# Patient Record
Sex: Female | Born: 1960 | Race: Asian | Hispanic: No | Marital: Single | Smoking: Never smoker
Health system: Southern US, Community
[De-identification: ages and names within clinical notes are randomized; demographics above are authoritative.]

## PROBLEM LIST (undated history)

## (undated) DIAGNOSIS — K589 Irritable bowel syndrome without diarrhea: Secondary | ICD-10-CM

## (undated) HISTORY — PX: EYE SURGERY: SHX253

## (undated) HISTORY — DX: Irritable bowel syndrome without diarrhea: K58.9

## (undated) HISTORY — PX: BREAST SURGERY: SHX581

---

## 2014-07-05 ENCOUNTER — Encounter (HOSPITAL_COMMUNITY): Payer: Self-pay | Admitting: Emergency Medicine

## 2014-07-05 ENCOUNTER — Emergency Department (HOSPITAL_COMMUNITY)
Admission: EM | Admit: 2014-07-05 | Discharge: 2014-07-05 | Disposition: A | Discharge status: Home / Self Care | Payer: 59 | Source: Home / Self Care | Attending: Family Medicine | Admitting: Family Medicine

## 2014-07-05 DIAGNOSIS — F3112 Bipolar disorder, current episode manic without psychotic features, moderate: Secondary | ICD-10-CM

## 2014-07-05 MED ORDER — QUETIAPINE FUMARATE 50 MG PO TABS: ORAL_TABLET | ORAL | Status: DC

## 2014-07-05 NOTE — ED Notes (Signed)
Patient complains of lack of attention, unable to concentrate, difficulty following directions, poor listening, gets upset easily and distracted.

## 2014-07-05 NOTE — ED Provider Notes (Signed)
CSN: [id number]     Arrival date & time 07/05/14  0830 History   First MD Initiated Contact with Patient 07/05/14 0900     Chief Complaint  Patient presents with  . Agitation  . Anxiety   (Consider location/radiation/quality/duration/timing/severity/associated sxs/prior Treatment) HPI   54 y/o patient here with two-week history irritability, distractibility, racing thoughts, and decreased sleep. She explains that over the last 10 years this has happened several times a year lasting 2 or 3 weeks at a time. She also states that she has 2-3 week episodes of depressed mood that happen several times a year. She states that she's been tolerating and compensating for it at work as well as she could until last night when her daughter came back from college and she snapped at her inappropriately. She also states that over the last 2 weeks she's been having difficulties in meetings at work. She states that her thoughts are racing, she has a hard time focusing on the content in meetings, and she feels like she can't stand to be in the meeting any longer.   History reviewed. No pertinent past medical history. History reviewed. No pertinent past surgical history. No family history on file. History  Substance Use Topics  . Smoking status: Not on file  . Smokeless tobacco: Not on file  . Alcohol Use: No   OB History    No data available     Review of Systems  Constitutional: Positive for appetite change. Negative for fever and chills.  Respiratory: Negative for cough and chest tightness.   Cardiovascular: Negative for chest pain and palpitations.  Psychiatric/Behavioral: Positive for behavioral problems, confusion, sleep disturbance, decreased concentration and agitation. Negative for suicidal ideas, self-injury and dysphoric mood. The patient is not nervous/anxious.        Allergies  Review of patient's allergies indicates no known allergies.  Home Medications   Prior to Admission  medications   Medication Sig Start Date End Date Taking? Authorizing Provider  Multiple Vitamin (MULTIVITAMIN) tablet Take 1 tablet by mouth daily.   Yes Historical Provider, MD  QUEtiapine (SEROQUEL) 50 MG tablet Take 1 pill twice on day 1, then 2 pills twice on day 2, then 3 pills twice on day 4 then 4 pills twice daily from then on. 07/05/14   Elenora Gamma, MD   BP 126/89 mmHg  Pulse 77  Temp(Src) 97.3 F (36.3 C) (Oral)  Resp 16  SpO2 98% Physical Exam  Constitutional: She is oriented to person, place, and time. She appears well-developed and well-nourished. No distress.  HENT:  Head: Normocephalic and atraumatic.  Eyes: Right eye exhibits no discharge. Left eye exhibits no discharge.  Neck: Neck supple. No thyromegaly present.  Cardiovascular: Normal rate, regular rhythm and normal heart sounds.  Exam reveals no friction rub.   No murmur heard. Pulmonary/Chest: Breath sounds normal. No respiratory distress. She has no wheezes.  Musculoskeletal: She exhibits no edema.  Neurological: She is alert and oriented to person, place, and time.  Skin: She is not diaphoretic.  Psychiatric: Her speech is rapid and/or pressured. She is hyperactive. Cognition and memory are not impaired. She expresses impulsivity. She expresses no homicidal and no suicidal ideation. She expresses no suicidal plans and no homicidal plans.  Pressured affect  Nursing note and vitals reviewed.  MDQ with 8 yes's on number 1, Yes on number 2, Moderate problem on number 3, yes on number 4, no answer on 5.   ED Course  Procedures (including critical  care time) Labs Review Labs Reviewed - No data to display  Imaging Review No results found.   MDM   1. Bipolar disorder, current episode manic without psychotic features, moderate    54 year old female here with likely new diagnosis of bipolar disorder and manic episode. She has a 10+ year history of manic and depressive episodes. We discussed this thoroughly  and we have recommended her starting Seroquel today. Today she declines the Seroquel but states she may call back in 1-2 days to request a refill and of the prescription. Notably she denies suicidal and homicidal ideation. I have personally called and verified availability of follow-up appointments in 6 weeks with a psychiatrist which she is open to. We've also asked her to come back in one week for follow-up here.  Pt seen and discussed with Dr. Denyse Amassorey and he agrees with the plan as discussed above.       Elenora GammaSamuel L Easton Fetty, MD 07/05/14 1022  Elenora GammaSamuel L Landy Mace, MD 07/05/14 [phone]

## 2014-07-05 NOTE — Discharge Instructions (Signed)
Please come back to see Dr. Corey neDenyse Amassxt Tuesday or Thursday  Please do not go to work if you are not making rational decisions.   Please call Dr. Alwyn Renottle's office today for an appointment.   Bipolar Disorder Bipolar disorder is a mental illness. The term bipolar disorder actually is used to describe a group of disorders that all share varying degrees of emotional highs and lows that can interfere with daily functioning, such as work, school, or relationships. Bipolar disorder also can lead to drug abuse, hospitalization, and suicide. The emotional highs of bipolar disorder are periods of elation or irritability and high energy. These highs can range from a mild form (hypomania) to a severe form (mania). People experiencing episodes of hypomania may appear energetic, excitable, and highly productive. People experiencing mania may behave impulsively or erratically. They often make poor decisions. They may have difficulty sleeping. The most severe episodes of mania can involve having very distorted beliefs or perceptions about the world and seeing or hearing things that are not real (psychotic delusions and hallucinations).  The emotional lows of bipolar disorder (depression) also can range from mild to severe. Severe episodes of bipolar depression can involve psychotic delusions and hallucinations. Sometimes people with bipolar disorder experience a state of mixed mood. Symptoms of hypomania or mania and depression are both present during this mixed-mood episode. SIGNS AND SYMPTOMS There are signs and symptoms of the episodes of hypomania and mania as well as the episodes of depression. The signs and symptoms of hypomania and mania are similar but vary in severity. They include:  Inflated self-esteem or feeling of increased self-confidence.  Decreased need for sleep.  Unusual talkativeness (rapid or pressured speech) or the feeling of a need to keep talking.  Sensation of racing thoughts or  constant talking, with quick shifts between topics that may or may not be related (flight of ideas).  Decreased ability to focus or concentrate.  Increased purposeful activity, such as work, studies, or social activity, or nonproductive activity, such as pacing, squirming and fidgeting, or finger and toe tapping.  Impulsive behavior and use of poor judgment, resulting in high-risk activities, such as having unprotected sex or spending excessive amounts of money. Signs and symptoms of depression include the following:   Feelings of sadness, hopelessness, or helplessness.  Frequent or uncontrollable episodes of crying.  Lack of feeling anything or caring about anything.  Difficulty sleeping or sleeping too much.  Inability to enjoy the things you used to enjoy.   Desire to be alone all the time.   Feelings of guilt or worthlessness.  Lack of energy or motivation.   Difficulty concentrating, remembering, or making decisions.  Change in appetite or weight beyond normal fluctuations.  Thoughts of death or the desire to harm yourself. DIAGNOSIS  Bipolar disorder is diagnosed through an assessment by your caregiver. Your caregiver will ask questions about your emotional episodes. There are two main types of bipolar disorder. People with type I bipolar disorder have manic episodes with or without depressive episodes. People with type II bipolar disorder have hypomanic episodes and major depressive episodes, which are more serious than mild depression. The type of bipolar disorder you have can make an important difference in how your illness is monitored and treated. Your caregiver may ask questions about your medical history and use of alcohol or drugs, including prescription medication. Certain medical conditions and substances also can cause emotional highs and lows that resemble bipolar disorder (secondary bipolar disorder).  TREATMENT  Bipolar  disorder is a long-term illness. It  is best controlled with continuous treatment rather than treatment only when symptoms occur. The following treatments can be prescribed for bipolar disorders:  Medication--Medication can be prescribed by a doctor that is an expert in treating mental disorders (psychiatrists). Medications called mood stabilizers are usually prescribed to help control the illness. Other medications are sometimes added if symptoms of mania, depression, or psychotic delusions and hallucinations occur despite the use of a mood stabilizer.  Talk therapy--Some forms of talk therapy are helpful in providing support, education, and guidance. A combination of medication and talk therapy is best for managing the disorder over time. A procedure in which electricity is applied to your brain through your scalp (electroconvulsive therapy) is used in cases of severe mania when medication and talk therapy do not work or work too slowly. Document Released: 08/05/2000 Document Revised: 08/24/2012 Document Reviewed: 05/25/2012 [facility] Patient Information 2015 [place], [place]. This information is not intended to replace advice given to you by your health care provider. Make sure you discuss any questions you have with your health care provider.

## 2014-07-05 NOTE — ED Notes (Signed)
Dr Merrilee Seashoresamual bradshaw spoke with patient in treatment room

## 2014-07-05 NOTE — ED Notes (Signed)
On discharge, patient reported not wanting to follow plan for medicines and follow up, notified provider

## 2014-07-05 NOTE — ED Notes (Signed)
Went to treatment room to discharge patient-not in treatment room, papers and script gone.

## 2014-07-05 NOTE — ED Provider Notes (Signed)
Tonya Schultz is a 54 y.o. female who presents to Urgent Care today for mania. Patient presents to clinic today complaining of agitation and irritability and insomnia. This has happened over the past several days. She notes that she is having many arguments with her child and has stormed out of work meetings over the past week or so. This has happened before over she never followed up for it and it resolves spontaneously. She notes a positive family history of family members with similar symptoms. She denies any history of a personal diagnosis of bipolar disorder or depression.  She denies any suicidality or homicidality. She denies any hallucinations and does not appear to express any delusions.   History reviewed. No pertinent past medical history. History reviewed. No pertinent past surgical history. History  Substance Use Topics  . Smoking status: Not on file  . Smokeless tobacco: Not on file  . Alcohol Use: No   ROS as above Medications: No current facility-administered medications for this encounter.   Current Outpatient Prescriptions  Medication Sig Dispense Refill  . Multiple Vitamin (MULTIVITAMIN) tablet Take 1 tablet by mouth daily.    . QUEtiapine (SEROQUEL) 50 MG tablet Take 1 pill twice on day 1, then 2 pills twice on day 2, then 3 pills twice on day 4 then 4 pills twice daily from then on. 100 tablet 0   No Known Allergies   Exam:  BP 126/89 mmHg  Pulse 77  Temp(Src) 97.3 F (36.3 C) (Oral)  Resp 16  SpO2 98% Gen: Well NAD Psych: Alert and oriented speech is somewhat rapid and somewhat tangential. However she is able to answer questions appropriately. She appears to have somewhat limited insight into her disease. She is worried that she was being diagnosed as "a maniac".  No SI or HI. No delusions or hallucinations expressed.   No results found for this or any previous visit (from the past 24 hour(s)). No results found.  Assessment and Plan: 54 y.o. female with mania.  Patient is displaying symptoms of mania or hypomania. She is not currently psychotic. She appears to be capable of making her decisions at this time. Both myself and Dr. Ermalinda MemosBradshaw adamantly expressed our concern and encouraged her to follow-up with psychiatry. She was given phone numbers to a psychiatrist office. Additionally she was given a prescription for Seroquel which she did not keep. I have encouraged her to return to clinic ASAP should she worsen. I would like to see her back certainly in one week regardless of how she is feeling.  Discussed warning signs or symptoms. Please see discharge instructions. Patient expresses understanding.     Rodolph BongEvan S Lamekia Nolden, MD 07/05/14 1044

## 2015-02-17 ENCOUNTER — Ambulatory Visit (INDEPENDENT_AMBULATORY_CARE_PROVIDER_SITE_OTHER): Payer: 59 | Admitting: Internal Medicine

## 2015-02-17 ENCOUNTER — Other Ambulatory Visit (INDEPENDENT_AMBULATORY_CARE_PROVIDER_SITE_OTHER): Payer: 59

## 2015-02-17 ENCOUNTER — Encounter: Payer: Self-pay | Admitting: Internal Medicine

## 2015-02-17 VITALS — BP 134/86 | HR 90 | Temp 98.5°F | Ht 59.0 in | Wt 88.0 lb

## 2015-02-17 DIAGNOSIS — B1910 Unspecified viral hepatitis B without hepatic coma: Secondary | ICD-10-CM

## 2015-02-17 DIAGNOSIS — Z0000 Encounter for general adult medical examination without abnormal findings: Secondary | ICD-10-CM

## 2015-02-17 DIAGNOSIS — R768 Other specified abnormal immunological findings in serum: Secondary | ICD-10-CM | POA: Insufficient documentation

## 2015-02-17 DIAGNOSIS — B169 Acute hepatitis B without delta-agent and without hepatic coma: Secondary | ICD-10-CM | POA: Diagnosis not present

## 2015-02-17 DIAGNOSIS — E785 Hyperlipidemia, unspecified: Secondary | ICD-10-CM | POA: Insufficient documentation

## 2015-02-17 DIAGNOSIS — K589 Irritable bowel syndrome without diarrhea: Secondary | ICD-10-CM | POA: Insufficient documentation

## 2015-02-17 LAB — LIPID PANEL
CHOL/HDL RATIO: 3
Cholesterol: 311 mg/dL — ABNORMAL HIGH (ref 0–200)
HDL: 104.1 mg/dL (ref >39.00)
LDL CALC: 177 mg/dL — AB (ref 0–99)
NONHDL: 206.63
Triglycerides: 147 mg/dL (ref 0.0–149.0)
VLDL: 29.4 mg/dL (ref 0.0–40.0)

## 2015-02-17 LAB — COMPREHENSIVE METABOLIC PANEL
ALT: 21 U/L (ref 0–35)
AST: 30 U/L (ref 0–37)
Albumin: 5.1 g/dL (ref 3.5–5.2)
Alkaline Phosphatase: 33 U/L — ABNORMAL LOW (ref 39–117)
BILIRUBIN TOTAL: 0.5 mg/dL (ref 0.2–1.2)
BUN: 13 mg/dL (ref 6–23)
CHLORIDE: 101 meq/L (ref 96–112)
CO2: 26 meq/L (ref 19–32)
CREATININE: 0.69 mg/dL (ref 0.40–1.20)
Calcium: 9.7 mg/dL (ref 8.4–10.5)
GFR: 95.67 mL/min (ref >60.00)
GLUCOSE: 89 mg/dL (ref 70–99)
Potassium: 3.7 mEq/L (ref 3.5–5.1)
SODIUM: 137 meq/L (ref 135–145)
Total Protein: 8.3 g/dL (ref 6.0–8.3)

## 2015-02-17 LAB — CBC WITH DIFFERENTIAL/PLATELET
BASOS ABS: 0 10*3/uL (ref 0.0–0.1)
BASOS PCT: 0.5 % (ref 0.0–3.0)
Eosinophils Absolute: 0.6 10*3/uL (ref 0.0–0.7)
Eosinophils Relative: 8.4 % — ABNORMAL HIGH (ref 0.0–5.0)
HCT: 42.3 % (ref 36.0–46.0)
Hemoglobin: 14.3 g/dL (ref 12.0–15.0)
LYMPHS PCT: 18.2 % (ref 12.0–46.0)
Lymphs Abs: 1.2 10*3/uL (ref 0.7–4.0)
MCHC: 33.8 g/dL (ref 30.0–36.0)
MCV: 92.7 fl (ref 78.0–100.0)
MONO ABS: 0.4 10*3/uL (ref 0.1–1.0)
Monocytes Relative: 5.2 % (ref 3.0–12.0)
NEUTROS ABS: 4.6 10*3/uL (ref 1.4–7.7)
NEUTROS PCT: 67.7 % (ref 43.0–77.0)
PLATELETS: 246 10*3/uL (ref 150.0–400.0)
RBC: 4.56 Mil/uL (ref 3.87–5.11)
RDW: 13.6 % (ref 11.5–15.5)
WBC: 6.8 10*3/uL (ref 4.0–10.5)

## 2015-02-17 LAB — PROTIME-INR
INR: 0.9 ratio (ref 0.8–1.0)
Prothrombin Time: 10.4 s (ref 9.6–13.1)

## 2015-02-17 LAB — TSH: TSH: 2.09 u[IU]/mL (ref 0.35–4.50)

## 2015-02-17 LAB — APTT: aPTT: 26.8 s (ref 23.4–32.7)

## 2015-02-17 NOTE — Patient Instructions (Addendum)
An EKG was done today.  Test(s) ordered today. Your results will be released to Tonya Schultz (or called to you) after review, usually within 72hours after test completion. If any changes need to be made, you will be notified at that same time.  All other Health Maintenance issues reviewed.   All recommended immunizations and age-appropriate screenings are up-to-date.  No immunizations administered today.   Medications reviewed and updated.  No changes recommended at this time.  Your results will be faxed to your surgeon along with my note clearing you for surgery.  Follow up as needed.  Health Maintenance, Female Adopting a healthy lifestyle and getting preventive care can go a long way to promote health and wellness. Talk with your health care provider about what schedule of regular examinations is right for you. This is a good chance for you to check in with your provider about disease prevention and staying healthy. In between checkups, there are plenty of things you can do on your own. Experts have done a lot of research about which lifestyle changes and preventive measures are most likely to keep you healthy. Ask your health care provider for more information. WEIGHT AND DIET  Eat a healthy diet  Be sure to include plenty of vegetables, fruits, low-fat dairy products, and lean protein.  Do not eat a lot of foods high in solid fats, added sugars, or salt.  Get regular exercise. This is one of the most important things you can do for your health.  Most adults should exercise for at least 150 minutes each week. The exercise should increase your heart rate and make you sweat (moderate-intensity exercise).  Most adults should also do strengthening exercises at least twice a week. This is in addition to the moderate-intensity exercise.  Maintain a healthy weight  Body mass index (BMI) is a measurement that can be used to identify possible weight problems. It estimates body fat based on  height and weight. Your health care provider can help determine your BMI and help you achieve or maintain a healthy weight.  For females 48 years of age and older:   A BMI below 18.5 is considered underweight.  A BMI of 18.5 to 24.9 is normal.  A BMI of 25 to 29.9 is considered overweight.  A BMI of 30 and above is considered obese.  Watch levels of cholesterol and blood lipids  You should start having your blood tested for lipids and cholesterol at 53 years of age, then have this test every 5 years.  You may need to have your cholesterol levels checked more often if:  Your lipid or cholesterol levels are high.  You are older than 53 years of age.  You are at high risk for heart disease.  CANCER SCREENING   Lung Cancer  Lung cancer screening is recommended for adults 44-73 years old who are at high risk for lung cancer because of a history of smoking.  A yearly low-dose CT scan of the lungs is recommended for people who:  Currently smoke.  Have quit within the past 15 years.  Have at least a 30-pack-year history of smoking. A pack year is smoking an average of one pack of cigarettes a day for 1 year.  Yearly screening should continue until it has been 15 years since you quit.  Yearly screening should stop if you develop a health problem that would prevent you from having lung cancer treatment.  Breast Cancer  Practice breast self-awareness. This means understanding how your  breasts normally appear and feel.  It also means doing regular breast self-exams. Let your health care provider know about any changes, no matter how small.  If you are in your 20s or 30s, you should have a clinical breast exam (CBE) by a health care provider every 1-3 years as part of a regular health exam.  If you are 37 or older, have a CBE every year. Also consider having a breast X-ray (mammogram) every year.  If you have a family history of breast cancer, talk to your health care  provider about genetic screening.  If you are at high risk for breast cancer, talk to your health care provider about having an MRI and a mammogram every year.  Breast cancer gene (BRCA) assessment is recommended for women who have family members with BRCA-related cancers. BRCA-related cancers include:  Breast.  Ovarian.  Tubal.  Peritoneal cancers.  Results of the assessment will determine the need for genetic counseling and BRCA1 and BRCA2 testing. Cervical Cancer Your health care provider may recommend that you be screened regularly for cancer of the pelvic organs (ovaries, uterus, and vagina). This screening involves a pelvic examination, including checking for microscopic changes to the surface of your cervix (Pap test). You may be encouraged to have this screening done every 3 years, beginning at age 42.  For women ages 59-65, health care providers may recommend pelvic exams and Pap testing every 3 years, or they may recommend the Pap and pelvic exam, combined with testing for human papilloma virus (HPV), every 5 years. Some types of HPV increase your risk of cervical cancer. Testing for HPV may also be done on women of any age with unclear Pap test results.  Other health care providers may not recommend any screening for nonpregnant women who are considered low risk for pelvic cancer and who do not have symptoms. Ask your health care provider if a screening pelvic exam is right for you.  If you have had past treatment for cervical cancer or a condition that could lead to cancer, you need Pap tests and screening for cancer for at least 20 years after your treatment. If Pap tests have been discontinued, your risk factors (such as having a new sexual partner) need to be reassessed to determine if screening should resume. Some women have medical problems that increase the chance of getting cervical cancer. In these cases, your health care provider may recommend more frequent screening and  Pap tests. Colorectal Cancer  This type of cancer can be detected and often prevented.  Routine colorectal cancer screening usually begins at 53 years of age and continues through 53 years of age.  Your health care provider may recommend screening at an earlier age if you have risk factors for colon cancer.  Your health care provider may also recommend using home test kits to check for hidden blood in the stool.  A small camera at the end of a tube can be used to examine your colon directly (sigmoidoscopy or colonoscopy). This is done to check for the earliest forms of colorectal cancer.  Routine screening usually begins at age 43.  Direct examination of the colon should be repeated every 5-10 years through 53 years of age. However, you may need to be screened more often if early forms of precancerous polyps or small growths are found. Skin Cancer  Check your skin from head to toe regularly.  Tell your health care provider about any new moles or changes in moles, especially if  there is a change in a mole's shape or color.  Also tell your health care provider if you have a mole that is larger than the size of a pencil eraser.  Always use sunscreen. Apply sunscreen liberally and repeatedly throughout the day.  Protect yourself by wearing long sleeves, pants, a wide-brimmed hat, and sunglasses whenever you are outside. HEART DISEASE, DIABETES, AND HIGH BLOOD PRESSURE   High blood pressure causes heart disease and increases the risk of stroke. High blood pressure is more likely to develop in:  People who have blood pressure in the high end of the normal range (130-139/85-89 mm Hg).  People who are overweight or obese.  People who are African American.  If you are 70-55 years of age, have your blood pressure checked every 3-5 years. If you are 46 years of age or older, have your blood pressure checked every year. You should have your blood pressure measured twice--once when you are at  a hospital or clinic, and once when you are not at a hospital or clinic. Record the average of the two measurements. To check your blood pressure when you are not at a hospital or clinic, you can use:  An automated blood pressure machine at a pharmacy.  A home blood pressure monitor.  If you are between 50 years and 72 years old, ask your health care provider if you should take aspirin to prevent strokes.  Have regular diabetes screenings. This involves taking a blood sample to check your fasting blood sugar level.  If you are at a normal weight and have a low risk for diabetes, have this test once every three years after 53 years of age.  If you are overweight and have a high risk for diabetes, consider being tested at a younger age or more often. PREVENTING INFECTION  Hepatitis B  If you have a higher risk for hepatitis B, you should be screened for this virus. You are considered at high risk for hepatitis B if:  You were born in a country where hepatitis B is common. Ask your health care provider which countries are considered high risk.  Your parents were born in a high-risk country, and you have not been immunized against hepatitis B (hepatitis B vaccine).  You have HIV or AIDS.  You use needles to inject street drugs.  You live with someone who has hepatitis B.  You have had sex with someone who has hepatitis B.  You get hemodialysis treatment.  You take certain medicines for conditions, including cancer, organ transplantation, and autoimmune conditions. Hepatitis C  Blood testing is recommended for:  Everyone born from [birth year] through 1964.  Anyone with known risk factors for hepatitis C. Sexually transmitted infections (STIs)  You should be screened for sexually transmitted infections (STIs) including gonorrhea and chlamydia if:  You are sexually active and are younger than 53 years of age.  You are older than 53 years of age and your health care provider tells you  that you are at risk for this type of infection.  Your sexual activity has changed since you were last screened and you are at an increased risk for chlamydia or gonorrhea. Ask your health care provider if you are at risk.  If you do not have HIV, but are at risk, it may be recommended that you take a prescription medicine daily to prevent HIV infection. This is called pre-exposure prophylaxis (PrEP). You are considered at risk if:  You are sexually active and do  not regularly use condoms or know the HIV status of your partner(s).  You take drugs by injection.  You are sexually active with a partner who has HIV. Talk with your health care provider about whether you are at high risk of being infected with HIV. If you choose to begin PrEP, you should first be tested for HIV. You should then be tested every 3 months for as long as you are taking PrEP.  PREGNANCY   If you are premenopausal and you may become pregnant, ask your health care provider about preconception counseling.  If you may become pregnant, take 400 to 800 micrograms (mcg) of folic acid every day.  If you want to prevent pregnancy, talk to your health care provider about birth control (contraception). OSTEOPOROSIS AND MENOPAUSE   Osteoporosis is a disease in which the bones lose minerals and strength with aging. This can result in serious bone fractures. Your risk for osteoporosis can be identified using a bone density scan.  If you are 59 years of age or older, or if you are at risk for osteoporosis and fractures, ask your health care provider if you should be screened.  Ask your health care provider whether you should take a calcium or vitamin D supplement to lower your risk for osteoporosis.  Menopause may have certain physical symptoms and risks.  Hormone replacement therapy may reduce some of these symptoms and risks. Talk to your health care provider about whether hormone replacement therapy is right for you.  HOME  CARE INSTRUCTIONS   Schedule regular health, dental, and eye exams.  Stay current with your immunizations.   Do not use any tobacco products including cigarettes, chewing tobacco, or electronic cigarettes.  If you are pregnant, do not drink alcohol.  If you are breastfeeding, limit how much and how often you drink alcohol.  Limit alcohol intake to no more than 1 drink per day for nonpregnant women. One drink equals 12 ounces of beer, 5 ounces of wine, or 1 ounces of hard liquor.  Do not use street drugs.  Do not share needles.  Ask your health care provider for help if you need support or information about quitting drugs.  Tell your health care provider if you often feel depressed.  Tell your health care provider if you have ever been abused or do not feel safe at home.   This information is not intended to replace advice given to you by your health care provider. Make sure you discuss any questions you have with your health care provider.   Document Released: 11/12/2010 Document Revised: 05/20/2014 Document Reviewed: 03/31/2013 Elsevier Interactive Patient Education [employer].

## 2015-02-17 NOTE — Progress Notes (Signed)
Pre visit review using our clinic review tool, if applicable. No additional management support is needed unless otherwise documented below in the visit note. 

## 2015-02-17 NOTE — Progress Notes (Signed)
Subjective:    Patient ID: Tonya Schultz, female    DOB: 01/06/1964, 54 y.o.   MRN: [id number]  HPI She is here to establish and for a physical exam.    She is scheduled for plastic surgery for her eyes next month and needs some testing. She denies any personal or family history of problems with anesthesia or bleeding/blood clot problems.   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  She did have evaluation with GI 5 years ago, but does not remember any of the details.  She wonders if she can spread the virus to family members.    Medications and allergies reviewed with patient and updated if appropriate.  Patient Active Problem List   Diagnosis Date Noted  . Dyslipidemia 02/17/2015  . Hepatitis B surface antigen positive 02/17/2015  . HBV (hepatitis B virus) infection 02/17/2015  . IBS (irritable bowel syndrome) 02/17/2015    Past Medical History  Diagnosis Date  . IBS (irritable bowel syndrome)     Past Surgical History  Procedure Laterality Date  . Breast surgery      implants  . Eye surgery      to correct vision - no lasik    Social History   Social History  . Marital Status: Single    Spouse Name: N/A  . Number of Children: 2  . Years of Education: N/A   Social History Main Topics  . Smoking status: Never Smoker   . Smokeless tobacco: Never Used  . Alcohol Use: 0.0 oz/week    0 Standard drinks or equivalent per week     Comment: occasionally  . Drug Use: No  . Sexual Activity: Yes   Other Topics Concern  . None   Social History Narrative   Orthoptist daily    Review of Systems  Constitutional: Negative for fever, chills, fatigue and unexpected weight change.  HENT: Positive for congestion. Negative for ear pain, hearing loss and sore throat.   Eyes: Negative for visual disturbance.  Respiratory: Negative for cough, shortness of breath and wheezing.   Cardiovascular: Negative.     Gastrointestinal: Positive for constipation. Negative for nausea, abdominal pain, diarrhea and blood in stool.  Genitourinary: Negative for dysuria and hematuria.  Musculoskeletal: Negative for myalgias, back pain and arthralgias.  Skin: Negative for rash.  Neurological: Negative for dizziness, light-headedness and headaches.  Psychiatric/Behavioral: Negative for dysphoric mood. The patient is not nervous/anxious.        Objective:   Filed Vitals:   02/17/15 0859  BP: 134/86  Pulse: 90  Temp: 98.5 F (36.9 C)   Filed Weights   02/17/15 0859  Weight: 88 lb (39.917 kg)   Body mass index is 17.76 kg/(m^2).   Physical Exam  Constitutional: She is oriented to person, place, and time. She appears well-developed and well-nourished. No distress.  HENT:  Head: Normocephalic and atraumatic.  Right Ear: External ear normal.  Left Ear: External ear normal.  Mouth/Throat: Oropharynx is clear and moist. No oropharyngeal exudate.  Eyes: Conjunctivae are normal.  Neck: Normal range of motion. Neck supple. No tracheal deviation present. No thyromegaly present.  No carotid bruit  Cardiovascular: Normal rate, regular rhythm and normal heart sounds.   No murmur heard. Pulmonary/Chest: Effort normal and breath sounds normal. No respiratory distress. She has no wheezes.  Abdominal: Soft. Bowel sounds are normal. She exhibits no distension. There  is no tenderness.  Musculoskeletal: She exhibits no edema.  Lymphadenopathy:    She has no cervical adenopathy.  Neurological: She is alert and oriented to person, place, and time.  Skin: Skin is warm and dry. No rash noted.  Psychiatric: She has a normal mood and affect. Her behavior is normal. Judgment and thought content normal.        Assessment & Plan:   Physical Exam: Screening blood work ordered Will order blood work to assess hepatits B status - likely chronic, latent disease. Depending on results we may refer to liver/GI. EKG  today Colonoscopy done in 2010 and told to return in 10 years mammogram up to date Gyn up to date Already had the flu shot Exercises daily No evidence of substance abuse No mental health concerns  She is cleared for surgery.  She is low risk for a low risk procedure.  Her EKG here today is normal - no evidence of ischemia.  Her blood work is pending.  No further testing needed prior to surgery.    Recommended annual physical.

## 2015-02-19 LAB — HEPATITIS B SURFACE ANTIBODY,QUALITATIVE: Hep B S Ab: NEGATIVE

## 2015-02-19 LAB — HEPATITIS B SURFACE ANTIGEN: Hepatitis B Surface Ag: POSITIVE — AB

## 2015-02-19 LAB — HEPATITIS B SURF AG CONFIRMATION: Hepatitis B Surf Ag Confirmation: POSITIVE — AB

## 2015-02-19 LAB — HEPATITIS B CORE ANTIBODY, TOTAL: HEP B C TOTAL AB: REACTIVE — AB

## 2015-02-21 ENCOUNTER — Encounter: Payer: Self-pay | Admitting: Internal Medicine

## 2015-02-22 LAB — HEPATITIS B DNA, ULTRAQUANTITATIVE, PCR
HEPATITIS B DNA (CALC): 6158 {copies}/mL — AB (ref <116)
HEPATITIS B DNA: 1058 [IU]/mL — AB (ref <20)

## 2015-02-24 ENCOUNTER — Encounter: Payer: Self-pay | Admitting: Internal Medicine

## 2015-02-24 NOTE — Addendum Note (Signed)
Addended by: Pincus SanesBURNS, Kadey Mihalic J on: 02/24/2015 07:14 AM   Modules accepted: Orders

## 2015-03-08 DIAGNOSIS — Z719 Counseling, unspecified: Secondary | ICD-10-CM | POA: Insufficient documentation

## 2015-04-26 DIAGNOSIS — L821 Other seborrheic keratosis: Secondary | ICD-10-CM | POA: Insufficient documentation

## 2015-05-10 DIAGNOSIS — G4710 Hypersomnia, unspecified: Secondary | ICD-10-CM | POA: Insufficient documentation

## 2015-09-18 ENCOUNTER — Encounter: Payer: Self-pay | Admitting: Internal Medicine

## 2015-09-18 NOTE — Telephone Encounter (Signed)
I did do a Physical on her in October and we did complete blood work - not sure if her insurance will cover another PE.  We can do repeat blood work to recheck her liver tests and cholesterol -- everything else looked good.  She can come in for a follow up and we can do some blood work

## 2015-10-08 DIAGNOSIS — L03031 Cellulitis of right toe: Secondary | ICD-10-CM | POA: Diagnosis not present

## 2015-10-25 DIAGNOSIS — Z1239 Encounter for other screening for malignant neoplasm of breast: Secondary | ICD-10-CM | POA: Diagnosis not present

## 2015-10-25 DIAGNOSIS — Z01419 Encounter for gynecological examination (general) (routine) without abnormal findings: Secondary | ICD-10-CM | POA: Diagnosis not present

## 2015-11-09 DIAGNOSIS — Z1231 Encounter for screening mammogram for malignant neoplasm of breast: Secondary | ICD-10-CM | POA: Diagnosis not present

## 2015-12-13 ENCOUNTER — Encounter: Payer: Self-pay | Admitting: Internal Medicine

## 2015-12-13 DIAGNOSIS — E785 Hyperlipidemia, unspecified: Secondary | ICD-10-CM

## 2015-12-22 ENCOUNTER — Encounter: Payer: Self-pay | Admitting: Internal Medicine

## 2015-12-22 ENCOUNTER — Ambulatory Visit (INDEPENDENT_AMBULATORY_CARE_PROVIDER_SITE_OTHER): Payer: 59 | Admitting: Internal Medicine

## 2015-12-22 ENCOUNTER — Other Ambulatory Visit (INDEPENDENT_AMBULATORY_CARE_PROVIDER_SITE_OTHER): Payer: 59

## 2015-12-22 DIAGNOSIS — J309 Allergic rhinitis, unspecified: Secondary | ICD-10-CM

## 2015-12-22 DIAGNOSIS — R51 Headache: Secondary | ICD-10-CM

## 2015-12-22 DIAGNOSIS — R519 Headache, unspecified: Secondary | ICD-10-CM

## 2015-12-22 DIAGNOSIS — E785 Hyperlipidemia, unspecified: Secondary | ICD-10-CM | POA: Diagnosis not present

## 2015-12-22 DIAGNOSIS — H1010 Acute atopic conjunctivitis, unspecified eye: Secondary | ICD-10-CM | POA: Insufficient documentation

## 2015-12-22 DIAGNOSIS — H1013 Acute atopic conjunctivitis, bilateral: Secondary | ICD-10-CM | POA: Diagnosis not present

## 2015-12-22 LAB — LIPID PANEL
Cholesterol: 264 mg/dL — ABNORMAL HIGH (ref 0–200)
HDL: 91.6 mg/dL (ref >39.00)
LDL CALC: 155 mg/dL — AB (ref 0–99)
NONHDL: 172.33
Total CHOL/HDL Ratio: 3
Triglycerides: 86 mg/dL (ref 0.0–149.0)
VLDL: 17.2 mg/dL (ref 0.0–40.0)

## 2015-12-22 LAB — COMPREHENSIVE METABOLIC PANEL
ALK PHOS: 31 U/L — AB (ref 39–117)
ALT: 17 U/L (ref 0–35)
AST: 23 U/L (ref 0–37)
Albumin: 4.7 g/dL (ref 3.5–5.2)
BUN: 18 mg/dL (ref 6–23)
CO2: 29 mEq/L (ref 19–32)
Calcium: 10 mg/dL (ref 8.4–10.5)
Chloride: 102 mEq/L (ref 96–112)
Creatinine, Ser: 0.77 mg/dL (ref 0.40–1.20)
GFR: 84.01 mL/min (ref >60.00)
GLUCOSE: 98 mg/dL (ref 70–99)
POTASSIUM: 4.1 meq/L (ref 3.5–5.1)
SODIUM: 139 meq/L (ref 135–145)
Total Bilirubin: 0.5 mg/dL (ref 0.2–1.2)
Total Protein: 7.7 g/dL (ref 6.0–8.3)

## 2015-12-22 MED ORDER — AZELASTINE HCL 0.05 % OP SOLN: 1.0000 [drp] | Freq: Two times a day (BID) | OPHTHALMIC | 12 refills | Status: AC | PRN

## 2015-12-22 NOTE — Assessment & Plan Note (Signed)
Mild to mod, for optivar prn asd,  to f/u any worsening symptoms or concerns

## 2015-12-22 NOTE — Progress Notes (Signed)
Subjective:    Patient ID: Tonya Schultz, female    DOB: 01/06/1964, 55 y.o.   MRN: [id number]  HPI    Here to f/u, with hx of latent chronic hep b, IBS like symptoms on dicyclomine prn, S/p cosmetic surgury nov 2016 uncomplicated; here with HA x 2 episodes, one with straining at stool and doing free weights, described as pressure to bilat head, lasts each time about 15 minutes, first noticed overall 2 wks ago, intermittent, mild to mod, no n/v, photophobia or phonophobia but "I cant remember but I kind of closed my eyes and sat on a chair", took asa and tylenol, Has some HA sometimes in the mornings but different and minor to her.  Pt denies chest pain, increased sob or doe, wheezing, orthopnea, PND, increased LE swelling, palpitations, dizziness or syncope.  Pt denies new neurological symptoms such as facial or extremity weakness or numbness.  Read on Google about HTN and risk of stroke, and gma had stroke, so these HA's made her very concerned.  Denies worsening depressive symptoms, suicidal ideation, or panic.  Does have significant nasal and eye allergic symptoms after working in the yard this summer Wt Readings from Last 3 Encounters:  12/22/15 87 lb 1.9 oz (39.5 kg)  02/17/15 88 lb (39.9 kg)  Has hx of severe hyperlipidemia, plans to f/u with lipid panel/cmp as ordered per Dr Lawerance Bach, later today.  Father has hx of elev lipids on statin Past Medical History:  Diagnosis Date  . IBS (irritable bowel syndrome)    Past Surgical History:  Procedure Laterality Date  . BREAST SURGERY     implants  . EYE SURGERY     to correct vision - no lasik    reports that she has never smoked. She has never used smokeless tobacco. She reports that she drinks alcohol. She reports that she does not use drugs. family history includes Arthritis in her mother; Melanoma in her maternal aunt. Allergies  Allergen Reactions  . Sulfamethoxazole-Trimethoprim Swelling    Lip swelling Lip swelling   Current  Outpatient Prescriptions on File Prior to Visit  Medication Sig Dispense Refill  . alendronate (FOSAMAX) 70 MG tablet     . dicyclomine (BENTYL) 20 MG tablet Take 20 mg by mouth every 8 (eight) hours as needed for spasms.    . Multiple Vitamin (MULTIVITAMIN) tablet Take 1 tablet by mouth daily.     No current facility-administered medications on file prior to visit.    Review of Systems   Constitutional: Negative for unusual diaphoresis or night sweats HENT: Negative for ear swelling or discharge Eyes: Negative for worsening visual haziness  Respiratory: Negative for choking and stridor.   Gastrointestinal: Negative for distension or worsening eructation Genitourinary: Negative for retention or change in urine volume.  Musculoskeletal: Negative for other MSK pain or swelling Skin: Negative for color change and worsening wound Neurological: Negative for tremors and numbness other than noted  Psychiatric/Behavioral: Negative for decreased concentration or agitation other than above       Objective:   Physical Exam BP 122/80 (BP Location: Left Arm, Patient Position: Sitting, Cuff Size: Small)   Pulse 60   Temp 98.3 F (36.8 C) (Oral)   Ht  (1.499 m)   Wt 87 lb 1.9 oz (39.5 kg)   BMI 17.60 kg/m   VS noted,  Constitutional: Pt appears in no apparent distress HENT: Head: NCAT.  Right Ear: External ear normal.  Left Ear: External ear normal.  Eyes: . Pupils are equal, round, and reactive to light. Conjunctivae with mild erythema and EOM are normal Bilat tm's with mild erythema.  Max sinus areas non tender.  Pharynx with mild erythema, no exudate Neck: Normal range of motion. Neck supple.  Cardiovascular: Normal rate and regular rhythm.   Pulmonary/Chest: Effort normal and breath sounds without rales or wheezing.  Abd:  Soft, NT, ND, + BS Neurological: Pt is alert. Not confused , motor 5/5 intact, cn 2-12 intact, dtr/sens intact, gait intact Skin: Skin is warm. No rash, no LE  edema Psychiatric: Pt behavior is normal. No agitation. mild tense and nervous appearing though denies     Assessment & Plan:

## 2015-12-22 NOTE — Assessment & Plan Note (Signed)
Lab Results  Component Value Date   LDLCALC 177 (H) 02/17/2015   Pt wanted to discuss further, has been working on diet, will re-check lipids, will consider statin as she is now 55yo

## 2015-12-22 NOTE — Patient Instructions (Signed)
Please take all new medication as prescribed  - the eye drops for allergies  You can also take the allegra OTC for your allergy symptoms (eye and sinus)  Please continue all other medications as before, and refills have been done if requested.  Please have the pharmacy call with any other refills you may need.  Please keep your appointments with your specialists as you may have planned  Please go to the LAB in the Basement (turn left off the elevator) for the tests to be done at your convenience for the cholesterol  These tests were ordered by Dr Lawerance BachBurns, and she may not see the results until she returns to the office from vacation in a few days

## 2015-12-22 NOTE — Assessment & Plan Note (Signed)
Mild to mod, for allegra prn, consider add nasacort as well but overall seems mild,  to f/u any worsening symptoms or concerns

## 2015-12-22 NOTE — Assessment & Plan Note (Signed)
2 episodes assoc with incresaed abd pressure (strain at stool and free wt lifting), transient, no sequalae, exam now benign, d/c pt - discomfort most likely due to transient increased intracranial pressure quickly resolved, ok to follow and avoid too heavy free wts and strain at stool

## 2015-12-24 ENCOUNTER — Encounter: Payer: Self-pay | Admitting: Internal Medicine

## 2016-01-10 ENCOUNTER — Encounter: Payer: Self-pay | Admitting: Internal Medicine

## 2016-01-10 DIAGNOSIS — E785 Hyperlipidemia, unspecified: Secondary | ICD-10-CM

## 2016-01-26 ENCOUNTER — Ambulatory Visit: Payer: 59 | Admitting: Nurse Practitioner

## 2016-02-14 ENCOUNTER — Ambulatory Visit (INDEPENDENT_AMBULATORY_CARE_PROVIDER_SITE_OTHER): Payer: 59 | Admitting: Nurse Practitioner

## 2016-02-14 ENCOUNTER — Other Ambulatory Visit: Payer: Self-pay | Admitting: *Deleted

## 2016-02-14 ENCOUNTER — Encounter: Payer: Self-pay | Admitting: Nurse Practitioner

## 2016-02-14 ENCOUNTER — Encounter: Payer: Self-pay | Admitting: *Deleted

## 2016-02-14 VITALS — BP 118/68 | Temp 98.3°F | Ht 59.0 in | Wt 87.0 lb

## 2016-02-14 DIAGNOSIS — B351 Tinea unguium: Secondary | ICD-10-CM

## 2016-02-14 DIAGNOSIS — L03011 Cellulitis of right finger: Secondary | ICD-10-CM | POA: Diagnosis not present

## 2016-02-14 MED ORDER — CICLOPIROX OLAMINE 0.77 % EX SUSP: 1.0000 "application " | Freq: Two times a day (BID) | CUTANEOUS | 1 refills | Status: AC

## 2016-02-14 MED ORDER — MUPIROCIN 2 % EX OINT: 1.0000 "application " | TOPICAL_OINTMENT | Freq: Two times a day (BID) | CUTANEOUS | 1 refills | Status: AC

## 2016-02-14 NOTE — Progress Notes (Signed)
Pre visit review using our clinic review tool, if applicable. No additional management support is needed unless otherwise documented below in the visit note. 

## 2016-02-14 NOTE — Patient Instructions (Addendum)
Patient declined referral to outpatient surgery for removal or additional trimming of thumbnail to prevent recurrence.  Paronychia  Paronychia is an infection of the skin. It happens near a fingernail or toenail. It may cause pain and swelling around the nail. Usually, it is not serious and it clears up with treatment. HOME CARE  Soak the fingers or toes in warm water as told by your doctor. You may be told to do this for 20 minutes, 2-3 times a day.  Keep the area dry when you are not soaking it.  Take medicines only as told by your doctor.  If you were given an antibiotic medicine, finish all of it even if you start to feel better.  Keep the affected area clean.  Do not try to drain a fluid-filled bump yourself.  Wear rubber gloves when putting your hands in water.  Wear gloves if your hands might touch cleaners or chemicals.  Follow your doctor's instructions about:  Wound care.  Bandage (dressing) changes and removal. GET HELP IF:  Your symptoms get worse or do not improve.  You have a fever or chills.  You have redness spreading from the affected area.  You have more fluid, blood, or pus coming from the affected area.  Your finger or knuckle is swollen or is hard to move.   This information is not intended to replace advice given to you by your health care provider. Make sure you discuss any questions you have with your health care provider.   Document Released: 04/17/2009 Document Revised: 09/13/2014 Document Reviewed: 04/06/2014 Elsevier Interactive Patient Education [employer]2016 [employer] Inc.

## 2016-02-14 NOTE — Progress Notes (Signed)
Subjective:  Patient ID: Tonya Schultz, female    DOB: 02-11-1961  Age: 55 y.o. MRN: [id number]  CC: Nail Problem (Pt stated thumb nail pain, swollen for about 2 months.)   HPI Right thumb pain. Presents with recurrent thumb nail redness and swelling. Ongoing for 2 months. Improved with treatment of nail and over-the-counter antibiotics ointment. Denies any nail or finger injury. Denies any joint pain.  Outpatient Medications Prior to Visit  Medication Sig Dispense Refill  . alendronate (FOSAMAX) 70 MG tablet     . aspirin EC 81 MG tablet Take by mouth.    Marland Kitchen. azelastine (OPTIVAR) 0.05 % ophthalmic solution Place 1 drop into both eyes 2 (two) times daily as needed. 6 mL 12  . Cholecalciferol (VITAMIN D3) 1000 units CAPS Take by mouth.    . Cholecalciferol (VITAMIN D3) 2000 units capsule Take by mouth.    . COPPER GLUCONATE PO Take by mouth.    . dicyclomine (BENTYL) 20 MG tablet Take 20 mg by mouth every 8 (eight) hours as needed for spasms.    Marland Kitchen. estradiol (ESTRACE) 1 MG tablet Take by mouth.    . Magnesium Gluconate 550 MG TABS Take 30 mg by mouth.    . Magnesium Sulfate 70 MG CAPS Take by mouth.    . modafinil (PROVIGIL) 100 MG tablet 1 bid    . Multiple Vitamin (MULTIVITAMIN) tablet Take 1 tablet by mouth daily.    Marland Kitchen. tretinoin (RETIN-A) 0.025 % cream Apply a thin layer to the face every other night.    . furosemide (LASIX) 20 MG tablet Take by mouth.    . medroxyPROGESTERone (PROVERA) 5 MG tablet 1 tab po daily Day 1-12 each month, starting July 1st.     No facility-administered medications prior to visit.     ROS See HPI  Objective:  BP 118/68   Temp 98.3 F (36.8 C)   Ht 4\' 11"  (1.499 m)   Wt 87 lb (39.5 kg)   SpO2 99%   BMI 17.57 kg/m   BP Readings from Last 3 Encounters:  02/14/16 118/68  12/22/15 122/80  02/17/15 134/86    Wt Readings from Last 3 Encounters:  02/14/16 87 lb (39.5 kg)  12/22/15 87 lb 1.9 oz (39.5 kg)  02/17/15 88 lb (39.9 kg)     Physical Exam  Constitutional: She is oriented to person, place, and time. No distress.  Musculoskeletal: Normal range of motion. She exhibits no edema.       Right hand: She exhibits tenderness and swelling. She exhibits normal range of motion and no bony tenderness. Normal sensation noted. Normal strength noted.       Hands: Mild redness and swelling around the thumbnail, no collection of purulent drainage. Medial aspect of the thumbnail already trimmed by patient. Remaining nail appears thickened. Dry and scaly cuticles. Normal distal pulses  Neurological: She is alert and oriented to person, place, and time.  Vitals reviewed.   Lab Results  Component Value Date   WBC 6.8 02/17/2015   HGB 14.3 02/17/2015   HCT 42.3 02/17/2015   PLT 246.0 02/17/2015   GLUCOSE 98 12/22/2015   CHOL 264 (H) 12/22/2015   TRIG 86.0 12/22/2015   HDL 91.60 12/22/2015   LDLCALC 155 (H) 12/22/2015   ALT 17 12/22/2015   AST 23 12/22/2015   NA 139 12/22/2015   K 4.1 12/22/2015   CL 102 12/22/2015   CREATININE 0.77 12/22/2015   BUN 18 12/22/2015   CO2 29  12/22/2015   TSH 2.09 02/17/2015   INR 0.9 02/17/2015    No results found.  Assessment & Plan:   Tonya Schultz was seen today for nail problem.  Diagnoses and all orders for this visit:  Paronychia of right thumb -     mupirocin ointment (BACTROBAN) 2 %; Place 1 application into the nose 2 (two) times daily.  Onychomycosis -     ciclopirox (LOPROX) 0.77 % SUSP; Apply 1 application topically 2 (two) times daily.   I have discontinued Tonya Schultz's medroxyPROGESTERone and furosemide. I am also having her start on mupirocin ointment and ciclopirox. Additionally, I am having her maintain her multivitamin, alendronate, dicyclomine, azelastine, modafinil, tretinoin, COPPER GLUCONATE PO, Magnesium Gluconate, Magnesium Sulfate, estradiol, aspirin EC, Vitamin D3, and Vitamin D3.  Meds ordered this encounter  Medications  . mupirocin ointment (BACTROBAN)  2 %    Sig: Place 1 application into the nose 2 (two) times daily.    Dispense:  22 g    Refill:  1    Order Specific Question:   Supervising Provider    Answer:   Tresa Garter [[id number]]  . ciclopirox (LOPROX) 0.77 % SUSP    Sig: Apply 1 application topically 2 (two) times daily.    Dispense:  30 mL    Refill:  1    Order Specific Question:   Supervising Provider    Answer:   Tresa Garter [[id number]]    Follow-up: Return if symptoms worsen or fail to improve.  Alysia Penna, NP

## 2016-03-29 ENCOUNTER — Encounter: Payer: Self-pay | Admitting: Internal Medicine

## 2016-03-29 DIAGNOSIS — M25519 Pain in unspecified shoulder: Secondary | ICD-10-CM

## 2016-04-01 ENCOUNTER — Encounter: Payer: Self-pay | Admitting: Internal Medicine

## 2016-04-01 ENCOUNTER — Other Ambulatory Visit (INDEPENDENT_AMBULATORY_CARE_PROVIDER_SITE_OTHER): Payer: 59

## 2016-04-01 DIAGNOSIS — E785 Hyperlipidemia, unspecified: Secondary | ICD-10-CM | POA: Diagnosis not present

## 2016-04-01 LAB — LIPID PANEL
Cholesterol: 253 mg/dL — ABNORMAL HIGH (ref 0–200)
HDL: 89.3 mg/dL (ref >39.00)
LDL Cholesterol: 146 mg/dL — ABNORMAL HIGH (ref 0–99)
NONHDL: 164.09
Total CHOL/HDL Ratio: 3
Triglycerides: 88 mg/dL (ref 0.0–149.0)
VLDL: 17.6 mg/dL (ref 0.0–40.0)

## 2016-04-02 NOTE — Telephone Encounter (Signed)
Does this need to be a referral?

## 2016-04-02 NOTE — Telephone Encounter (Signed)
I did put a referral in, but it is ok for someone to make an appt without a referral - it can be done when they see Dr Katrinka BlazingSmith.  Please help schedule.

## 2016-04-09 ENCOUNTER — Encounter: Payer: Self-pay | Admitting: Internal Medicine

## 2016-04-17 NOTE — Progress Notes (Signed)
Tawana ScaleZach Gerlene Glassburn D.O. [facility] [address] [place], [place] [address] Phone: [phone] Subjective:    I'm seeing this patient by the request  of:  Pincus SanesStacy J Burns, MD   CC: right shoulder pain   [id number]:Subjective  Tonya Schultz is a 55 y.o. female coming in with complaint of right shoulder pain.Patient states over a month ago was doing an exercise class and felt some pain. Patient states this and continues to get worse. Seems to be on the anterior aspect of the arm. Worse with sleeping at night. States that it's difficult to move in the morning. States that now even some daily activities can give her some discomfort. Mild radiation down the arm. Discuss it as more of a burning sensation. No neck pain associated with it. Does respond somewhat to over-the-counter anti-inflammatories.     Past Medical History:  Diagnosis Date  . IBS (irritable bowel syndrome)    Past Surgical History:  Procedure Laterality Date  . BREAST SURGERY     implants  . EYE SURGERY     to correct vision - no lasik   Social History   Social History  . Marital status: Single    Spouse name: N/A  . Number of children: 2  . Years of education: N/A   Social History Main Topics  . Smoking status: Never Smoker  . Smokeless tobacco: Never Used  . Alcohol use 0.0 oz/week     Comment: occasionally  . Drug use: No  . Sexual activity: Yes   Other Topics Concern  . None   Social History Narrative   OrthoptistComputer programmer      Exercises daily   Allergies  Allergen Reactions  . Sulfamethoxazole-Trimethoprim Swelling    Lip swelling Lip swelling   Family History  Problem Relation Age of Onset  . Arthritis Mother   . Melanoma Maternal Aunt     Past medical history, social, surgical and family history all reviewed in electronic medical record.  No pertanent information unless stated regarding to the chief complaint.   Review of Systems:Review of systems updated and as accurate as of 04/18/16  No headache, visual changes, nausea, vomiting, diarrhea, constipation, dizziness, abdominal pain, skin rash, fevers, chills, night sweats, weight loss, swollen lymph nodes, body aches, joint swelling, muscle aches, chest pain, shortness of breath, mood changes.   Objective  Blood pressure 110/72, pulse 67, height 4\' 11"  (1.499 m), weight 89 lb (40.4 kg), SpO2 99 %. Systems examined below as of 04/18/16   General: No apparent distress alert and oriented x3 mood and affect normal, dressed appropriately.  HEENT: Pupils equal, extraocular movements intact  Respiratory: Patient's speak in full sentences and does not appear short of breath  Cardiovascular: No lower extremity edema, non tender, no erythema  Skin: Warm dry intact with no signs of infection or rash on extremities or on axial skeleton.  Abdomen: Soft nontender  Neuro: Cranial nerves II through XII are intact, neurovascularly intact in all extremities with 2+ DTRs and 2+ pulses.  Lymph: No lymphadenopathy of posterior or anterior cervical chain or axillae bilaterally.  Gait normal with good balance and coordination.  MSK:  Non tender with full range of motion and good stability and symmetric strength and tone of  elbows, wrist, hip, knee and ankles bilaterally.  Shoulder: Right Inspection reveals no abnormalities, atrophy or asymmetry. Palpation is normal with no tenderness over AC joint or bicipital groove. Mild limitation in internal rotation but otherwise unremarkable Rotator cuff  strength 4 out of 5 compared to the contralateral side signs of impingement with positive Neer and Hawkin's tests, but negative empty can sign. Speeds and Yergason's tests normal. Mild positive labral pathology Normal scapular function observed. No painful arc and no drop arm sign. No apprehension sign Hunter lateral shoulder unremarkable  MSK US performed of: Right This study was ordered, performed, and interpreted by Terrilee FilesZach Augusten Lipkin D.O.  Shoulder:     Supraspinatus:  Difficult to assess if this is a tear. Possible space occupying cyst causing compression. Mild increase in Doppler flow but no increasing vascularity. Infraspinatus:  Appears normal on long and transverse views. Significant increase in Doppler flow Subscapularis:  Appears normal on long and transverse views. Positive bursa Teres Minor:  Appears normal on long and transverse views. AC joint: Minimal arthritis Glenohumeral Joint:  Appears normal without effusion. Glenoid Labrum:  Anterior aspect does have a cyst that seems to being compressible. Biceps Tendon: Hypoechoic changes. Cyst is extra luminal from tendon sheath.  Impression: Anterior shoulder cyst that appears to go intra-articular. Questionable rotator cuff tear  Procedure: Real-time Ultrasound Guided Injection of right glenohumeral joint Device: GE Logiq E  Ultrasound guided injection is preferred based studies that show increased duration, increased effect, greater accuracy, decreased procedural pain, increased response rate with ultrasound guided versus blind injection.  Verbal informed consent obtained.  Time-out conducted.  Noted no overlying erythema, induration, or other signs of local infection.  Skin prepped in a sterile fashion.  Local anesthesia: Topical Ethyl chloride.  With sterile technique and under real time ultrasound guidance:  Joint visualized.  2g 2 inch needle inserted anterior approach. Pictures taken for needle placement. Patient did have injection of 2 cc of 0.5% Marcaine, and failed aspiration due to the thickness of the material 1.0 cc of Kenalog 40 mg/dL. Completed without difficulty  Pain immediately resolved suggesting accurate placement of the medication.  Advised to call if fevers/chills, erythema, induration, drainage, or persistent bleeding.  Images permanently stored and available for review in the ultrasound unit.  Impression:  ultrasound guided injection was failed aspiration.     Impression and Recommendations:     This case required medical decision making of moderate complexity.      Note: This dictation was prepared with Dragon dictation along with smaller phrase technology. Any transcriptional errors that result from this process are unintentional.

## 2016-04-18 ENCOUNTER — Ambulatory Visit (INDEPENDENT_AMBULATORY_CARE_PROVIDER_SITE_OTHER): Payer: 59 | Admitting: Family Medicine

## 2016-04-18 ENCOUNTER — Ambulatory Visit: Payer: Self-pay

## 2016-04-18 ENCOUNTER — Ambulatory Visit (INDEPENDENT_AMBULATORY_CARE_PROVIDER_SITE_OTHER)
Admission: RE | Admit: 2016-04-18 | Discharge: 2016-04-18 | Disposition: A | Discharge status: Home / Self Care | Payer: 59 | Source: Ambulatory Visit | Attending: Family Medicine | Admitting: Family Medicine

## 2016-04-18 ENCOUNTER — Encounter: Payer: Self-pay | Admitting: Family Medicine

## 2016-04-18 DIAGNOSIS — S43431A Superior glenoid labrum lesion of right shoulder, initial encounter: Secondary | ICD-10-CM

## 2016-04-18 DIAGNOSIS — M25511 Pain in right shoulder: Secondary | ICD-10-CM

## 2016-04-18 MED ORDER — DICLOFENAC SODIUM 2 % TD SOLN
2.0000 | Freq: Two times a day (BID) | TRANSDERMAL | 3 refills | Status: AC
Start: 2016-04-18 — End: ?

## 2016-04-18 NOTE — Patient Instructions (Addendum)
Good to see you.  Ice 20 minutes 2 times daily. Usually after activity and before bed. pennsaid pinkie amount topically 2 times daily as needed.  Keep hands within peripheral vision with activity  Avoid heavy lifting  Exercises 3 times a week.  See me again in 4 weeks.

## 2016-04-18 NOTE — Assessment & Plan Note (Signed)
Patient does have what appears to be more of the cyst. Discussed with patient at great length. I do believe that this could be. Labral and patient could have an intra-articular tear. Discussed with patient that this could also be a ganglion cyst and likely would need potential removal. Patient was a try conservative therapy including home exercises on topical anti-inflammatories. Hopefully the corticosteroid knees today and we will decrease the size of the cyst. Patient come back again in 4 weeks for further evaluation.

## 2016-04-20 DIAGNOSIS — R1030 Lower abdominal pain, unspecified: Secondary | ICD-10-CM | POA: Diagnosis not present

## 2016-05-02 ENCOUNTER — Other Ambulatory Visit (INDEPENDENT_AMBULATORY_CARE_PROVIDER_SITE_OTHER): Payer: 59

## 2016-05-02 ENCOUNTER — Ambulatory Visit (INDEPENDENT_AMBULATORY_CARE_PROVIDER_SITE_OTHER): Payer: 59 | Admitting: Internal Medicine

## 2016-05-02 ENCOUNTER — Encounter: Payer: Self-pay | Admitting: Internal Medicine

## 2016-05-02 VITALS — BP 134/88 | HR 64 | Temp 98.1°F | Resp 16 | Ht 59.0 in | Wt 87.0 lb

## 2016-05-02 DIAGNOSIS — B1910 Unspecified viral hepatitis B without hepatic coma: Secondary | ICD-10-CM

## 2016-05-02 DIAGNOSIS — M810 Age-related osteoporosis without current pathological fracture: Secondary | ICD-10-CM

## 2016-05-02 DIAGNOSIS — K589 Irritable bowel syndrome without diarrhea: Secondary | ICD-10-CM

## 2016-05-02 DIAGNOSIS — Z0000 Encounter for general adult medical examination without abnormal findings: Secondary | ICD-10-CM

## 2016-05-02 LAB — LIPID PANEL
CHOLESTEROL: 244 mg/dL — AB (ref 0–200)
HDL: 92 mg/dL (ref >39.00)
LDL CALC: 136 mg/dL — AB (ref 0–99)
NonHDL: 151.89
Total CHOL/HDL Ratio: 3
Triglycerides: 77 mg/dL (ref 0.0–149.0)
VLDL: 15.4 mg/dL (ref 0.0–40.0)

## 2016-05-02 LAB — CBC WITH DIFFERENTIAL/PLATELET
BASOS PCT: 0.7 % (ref 0.0–3.0)
Basophils Absolute: 0 10*3/uL (ref 0.0–0.1)
EOS PCT: 7.2 % — AB (ref 0.0–5.0)
Eosinophils Absolute: 0.4 10*3/uL (ref 0.0–0.7)
HCT: 39.1 % (ref 36.0–46.0)
Hemoglobin: 13.4 g/dL (ref 12.0–15.0)
LYMPHS ABS: 1.7 10*3/uL (ref 0.7–4.0)
Lymphocytes Relative: 29.3 % (ref 12.0–46.0)
MCHC: 34.1 g/dL (ref 30.0–36.0)
MCV: 91.9 fl (ref 78.0–100.0)
MONO ABS: 0.2 10*3/uL (ref 0.1–1.0)
Monocytes Relative: 3.7 % (ref 3.0–12.0)
NEUTROS PCT: 59.1 % (ref 43.0–77.0)
Neutro Abs: 3.4 10*3/uL (ref 1.4–7.7)
Platelets: 254 10*3/uL (ref 150.0–400.0)
RBC: 4.26 Mil/uL (ref 3.87–5.11)
RDW: 13.5 % (ref 11.5–15.5)
WBC: 5.7 10*3/uL (ref 4.0–10.5)

## 2016-05-02 LAB — COMPREHENSIVE METABOLIC PANEL
ALBUMIN: 4.9 g/dL (ref 3.5–5.2)
ALT: 16 U/L (ref 0–35)
AST: 22 U/L (ref 0–37)
Alkaline Phosphatase: 33 U/L — ABNORMAL LOW (ref 39–117)
BUN: 18 mg/dL (ref 6–23)
CHLORIDE: 105 meq/L (ref 96–112)
CO2: 30 mEq/L (ref 19–32)
Calcium: 9.6 mg/dL (ref 8.4–10.5)
Creatinine, Ser: 0.69 mg/dL (ref 0.40–1.20)
GFR: 94.12 mL/min (ref >60.00)
GLUCOSE: 98 mg/dL (ref 70–99)
POTASSIUM: 3.7 meq/L (ref 3.5–5.1)
SODIUM: 142 meq/L (ref 135–145)
Total Bilirubin: 0.5 mg/dL (ref 0.2–1.2)
Total Protein: 7.9 g/dL (ref 6.0–8.3)

## 2016-05-02 LAB — TSH: TSH: 1.49 u[IU]/mL (ref 0.35–4.50)

## 2016-05-02 LAB — VITAMIN D 25 HYDROXY (VIT D DEFICIENCY, FRACTURES): VITD: 54.62 ng/mL (ref 30.00–100.00)

## 2016-05-02 LAB — FERRITIN: Ferritin: 12.5 ng/mL (ref 10.0–291.0)

## 2016-05-02 LAB — IRON: Iron: 84 ug/dL (ref 42–145)

## 2016-05-02 NOTE — Assessment & Plan Note (Signed)
Takes dicyclomine

## 2016-05-02 NOTE — Assessment & Plan Note (Signed)
dexa up to date - management by gyn On alendronate Taking cal, vitamin d - check D level Exercising regularly

## 2016-05-02 NOTE — Progress Notes (Signed)
Pre visit review using our clinic review tool, if applicable. No additional management support is needed unless otherwise documented below in the visit note. 

## 2016-05-02 NOTE — Patient Instructions (Addendum)
Test(s) ordered today. Your results will be released to [employer] (or called to you) after review, usually within 72hours after test completion. If any changes need to be made, you will be notified at that same time.  All other Health Maintenance issues reviewed.   All recommended immunizations and age-appropriate screenings are up-to-date or discussed.  No immunizations administered today.   Medications reviewed and updated.  No changes recommended at this time.   A referral was ordered for a liver specialist.  Please followup in one year    Health Maintenance, Female Introduction Adopting a healthy lifestyle and getting preventive care can go a long way to promote health and wellness. Talk with your health care provider about what schedule of regular examinations is right for you. This is a good chance for you to check in with your provider about disease prevention and staying healthy. In between checkups, there are plenty of things you can do on your own. Experts have done a lot of research about which lifestyle changes and preventive measures are most likely to keep you healthy. Ask your health care provider for more information. Weight and diet Eat a healthy diet  Be sure to include plenty of vegetables, fruits, low-fat dairy products, and lean protein.  Do not eat a lot of foods high in solid fats, added sugars, or salt.  Get regular exercise. This is one of the most important things you can do for your health.  Most adults should exercise for at least 150 minutes each week. The exercise should increase your heart rate and make you sweat (moderate-intensity exercise).  Most adults should also do strengthening exercises at least twice a week. This is in addition to the moderate-intensity exercise. Maintain a healthy weight  Body mass index (BMI) is a measurement that can be used to identify possible weight problems. It estimates body fat based on height and weight. Your health  care provider can help determine your BMI and help you achieve or maintain a healthy weight.  For females 54 years of age and older:  A BMI below 18.5 is considered underweight.  A BMI of 18.5 to 24.9 is normal.  A BMI of 25 to 29.9 is considered overweight.  A BMI of 30 and above is considered obese. Watch levels of cholesterol and blood lipids  You should start having your blood tested for lipids and cholesterol at 54 years of age, then have this test every 5 years.  You may need to have your cholesterol levels checked more often if:  Your lipid or cholesterol levels are high.  You are older than 54 years of age.  You are at high risk for heart disease. Cancer screening Lung Cancer  Lung cancer screening is recommended for adults 33-25 years old who are at high risk for lung cancer because of a history of smoking.  A yearly low-dose CT scan of the lungs is recommended for people who:  Currently smoke.  Have quit within the past 15 years.  Have at least a 30-pack-year history of smoking. A pack year is smoking an average of one pack of cigarettes a day for 1 year.  Yearly screening should continue until it has been 15 years since you quit.  Yearly screening should stop if you develop a health problem that would prevent you from having lung cancer treatment. Breast Cancer  Practice breast self-awareness. This means understanding how your breasts normally appear and feel.  It also means doing regular breast self-exams. Let your  health care provider know about any changes, no matter how small.  If you are in your 20s or 30s, you should have a clinical breast exam (CBE) by a health care provider every 1-3 years as part of a regular health exam.  If you are 44 or older, have a CBE every year. Also consider having a breast X-ray (mammogram) every year.  If you have a family history of breast cancer, talk to your health care provider about genetic screening.  If you are at  high risk for breast cancer, talk to your health care provider about having an MRI and a mammogram every year.  Breast cancer gene (BRCA) assessment is recommended for women who have family members with BRCA-related cancers. BRCA-related cancers include:  Breast.  Ovarian.  Tubal.  Peritoneal cancers.  Results of the assessment will determine the need for genetic counseling and BRCA1 and BRCA2 testing. Cervical Cancer  Your health care provider may recommend that you be screened regularly for cancer of the pelvic organs (ovaries, uterus, and vagina). This screening involves a pelvic examination, including checking for microscopic changes to the surface of your cervix (Pap test). You may be encouraged to have this screening done every 3 years, beginning at age 5.  For women ages 46-65, health care providers may recommend pelvic exams and Pap testing every 3 years, or they may recommend the Pap and pelvic exam, combined with testing for human papilloma virus (HPV), every 5 years. Some types of HPV increase your risk of cervical cancer. Testing for HPV may also be done on women of any age with unclear Pap test results.  Other health care providers may not recommend any screening for nonpregnant women who are considered low risk for pelvic cancer and who do not have symptoms. Ask your health care provider if a screening pelvic exam is right for you.  If you have had past treatment for cervical cancer or a condition that could lead to cancer, you need Pap tests and screening for cancer for at least 20 years after your treatment. If Pap tests have been discontinued, your risk factors (such as having a new sexual partner) need to be reassessed to determine if screening should resume. Some women have medical problems that increase the chance of getting cervical cancer. In these cases, your health care provider may recommend more frequent screening and Pap tests. Colorectal Cancer  This type of cancer  can be detected and often prevented.  Routine colorectal cancer screening usually begins at 54 years of age and continues through 54 years of age.  Your health care provider may recommend screening at an earlier age if you have risk factors for colon cancer.  Your health care provider may also recommend using home test kits to check for hidden blood in the stool.  A small camera at the end of a tube can be used to examine your colon directly (sigmoidoscopy or colonoscopy). This is done to check for the earliest forms of colorectal cancer.  Routine screening usually begins at age 76.  Direct examination of the colon should be repeated every 5-10 years through 54 years of age. However, you may need to be screened more often if early forms of precancerous polyps or small growths are found. Skin Cancer  Check your skin from head to toe regularly.  Tell your health care provider about any new moles or changes in moles, especially if there is a change in a mole's shape or color.  Also tell your  health care provider if you have a mole that is larger than the size of a pencil eraser.  Always use sunscreen. Apply sunscreen liberally and repeatedly throughout the day.  Protect yourself by wearing long sleeves, pants, a wide-brimmed hat, and sunglasses whenever you are outside. Heart disease, diabetes, and high blood pressure  High blood pressure causes heart disease and increases the risk of stroke. High blood pressure is more likely to develop in:  People who have blood pressure in the high end of the normal range (130-139/85-89 mm Hg).  People who are overweight or obese.  People who are African American.  If you are 48-27 years of age, have your blood pressure checked every 3-5 years. If you are 7 years of age or older, have your blood pressure checked every year. You should have your blood pressure measured twice-once when you are at a hospital or clinic, and once when you are not at a  hospital or clinic. Record the average of the two measurements. To check your blood pressure when you are not at a hospital or clinic, you can use:  An automated blood pressure machine at a pharmacy.  A home blood pressure monitor.  If you are between 45 years and 66 years old, ask your health care provider if you should take aspirin to prevent strokes.  Have regular diabetes screenings. This involves taking a blood sample to check your fasting blood sugar level.  If you are at a normal weight and have a low risk for diabetes, have this test once every three years after 54 years of age.  If you are overweight and have a high risk for diabetes, consider being tested at a younger age or more often. Preventing infection Hepatitis B  If you have a higher risk for hepatitis B, you should be screened for this virus. You are considered at high risk for hepatitis B if:  You were born in a country where hepatitis B is common. Ask your health care provider which countries are considered high risk.  Your parents were born in a high-risk country, and you have not been immunized against hepatitis B (hepatitis B vaccine).  You have HIV or AIDS.  You use needles to inject street drugs.  You live with someone who has hepatitis B.  You have had sex with someone who has hepatitis B.  You get hemodialysis treatment.  You take certain medicines for conditions, including cancer, organ transplantation, and autoimmune conditions. Hepatitis C  Blood testing is recommended for:  Everyone born from [birth year] through 1964.  Anyone with known risk factors for hepatitis C. Sexually transmitted infections (STIs)  You should be screened for sexually transmitted infections (STIs) including gonorrhea and chlamydia if:  You are sexually active and are younger than 54 years of age.  You are older than 54 years of age and your health care provider tells you that you are at risk for this type of  infection.  Your sexual activity has changed since you were last screened and you are at an increased risk for chlamydia or gonorrhea. Ask your health care provider if you are at risk.  If you do not have HIV, but are at risk, it may be recommended that you take a prescription medicine daily to prevent HIV infection. This is called pre-exposure prophylaxis (PrEP). You are considered at risk if:  You are sexually active and do not regularly use condoms or know the HIV status of your partner(s).  You take drugs  by injection.  You are sexually active with a partner who has HIV. Talk with your health care provider about whether you are at high risk of being infected with HIV. If you choose to begin PrEP, you should first be tested for HIV. You should then be tested every 3 months for as long as you are taking PrEP. Pregnancy  If you are premenopausal and you may become pregnant, ask your health care provider about preconception counseling.  If you may become pregnant, take 400 to 800 micrograms (mcg) of folic acid every day.  If you want to prevent pregnancy, talk to your health care provider about birth control (contraception). Osteoporosis and menopause  Osteoporosis is a disease in which the bones lose minerals and strength with aging. This can result in serious bone fractures. Your risk for osteoporosis can be identified using a bone density scan.  If you are 22 years of age or older, or if you are at risk for osteoporosis and fractures, ask your health care provider if you should be screened.  Ask your health care provider whether you should take a calcium or vitamin D supplement to lower your risk for osteoporosis.  Menopause may have certain physical symptoms and risks.  Hormone replacement therapy may reduce some of these symptoms and risks. Talk to your health care provider about whether hormone replacement therapy is right for you. Follow these instructions at home:  Schedule  regular health, dental, and eye exams.  Stay current with your immunizations.  Do not use any tobacco products including cigarettes, chewing tobacco, or electronic cigarettes.  If you are pregnant, do not drink alcohol.  If you are breastfeeding, limit how much and how often you drink alcohol.  Limit alcohol intake to no more than 1 drink per day for nonpregnant women. One drink equals 12 ounces of beer, 5 ounces of wine, or 1 ounces of hard liquor.  Do not use street drugs.  Do not share needles.  Ask your health care provider for help if you need support or information about quitting drugs.  Tell your health care provider if you often feel depressed.  Tell your health care provider if you have ever been abused or do not feel safe at home. This information is not intended to replace advice given to you by your health care provider. Make sure you discuss any questions you have with your health care provider. Document Released: 11/12/2010 Document Revised: 10/05/2015 Document Reviewed: 01/31/2015  2017 [employer]

## 2016-05-02 NOTE — Progress Notes (Signed)
Subjective:    Patient ID: Tonya Schultz, female    DOB: Jan 31, 1961, 55 y.o.   MRN: [id number]  HPI She is here for a physical exam.   She denies changes in her health.  She wonders if therapeutic phlebotomies would help her hepatitis B.    She is exercising regularly - runs a lot.    Medications and allergies reviewed with patient and updated if appropriate.  Patient Active Problem List   Diagnosis Date Noted  . Osteoporosis 05/02/2016  . Paralabral cyst of right shoulder 04/18/2016  . Allergic rhinitis 12/22/2015  . Allergic conjunctivitis 12/22/2015  . Headache 12/22/2015  . Excessive sleepiness 05/10/2015  . 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 Dyslipidemia 02/17/2015  . Hepatitis B surface antigen positive 02/17/2015  . HBV (hepatitis B virus) infection 02/17/2015  . IBS (irritable bowel syndrome) 02/17/2015    Current Outpatient Prescriptions on File Prior to Visit  Medication Sig Dispense Refill  . alendronate (FOSAMAX) 70 MG tablet     . aspirin EC 81 MG tablet Take by mouth.    Marland Kitchen. azelastine (OPTIVAR) 0.05 % ophthalmic solution Place 1 drop into both eyes 2 (two) times daily as needed. 6 mL 12  . Cholecalciferol (VITAMIN D3) 1000 units CAPS Take by mouth.    . Cholecalciferol (VITAMIN D3) 2000 units capsule Take by mouth.    . ciclopirox (LOPROX) 0.77 % SUSP Apply 1 application topically 2 (two) times daily. 30 mL 1  . COPPER GLUCONATE PO Take by mouth.    . Diclofenac Sodium (PENNSAID) 2 % SOLN Place 2 application onto the skin 2 (two) times daily. 112 g 3  . dicyclomine (BENTYL) 20 MG tablet Take 20 mg by mouth every 8 (eight) hours as needed for spasms.    Marland Kitchen. estradiol (ESTRACE) 1 MG tablet Take by mouth.    . Magnesium Sulfate 70 MG CAPS Take by mouth.    . modafinil (PROVIGIL) 100 MG tablet 1 bid    . Multiple Vitamin (MULTIVITAMIN) tablet Take 1 tablet by mouth daily.    . mupirocin ointment (BACTROBAN) 2 % Place 1 application into the nose 2 (two) times  daily. 22 g 1  . tretinoin (RETIN-A) 0.025 % cream Apply a thin layer to the face every other night.     No current facility-administered medications on file prior to visit.     Past Medical History:  Diagnosis Date  . IBS (irritable bowel syndrome)     Past Surgical History:  Procedure Laterality Date  . BREAST SURGERY     implants  . EYE SURGERY     to correct vision - no lasik    Social History   Social History  . Marital status: Single    Spouse name: N/A  . Number of children: 2  . Years of education: N/A   Social History Main Topics  . Smoking status: Never Smoker  . Smokeless tobacco: Never Used  . Alcohol use 0.0 oz/week     Comment: occasionally  . Drug use: No  . Sexual activity: Yes   Other Topics Concern  . None   Social History Narrative   OrthoptistComputer programmer      Exercises daily    Family History  Problem Relation Age of Onset  . Arthritis Mother   . Melanoma Maternal Aunt     Review of Systems  Constitutional: Negative for chills and fever.  Eyes: Negative for visual disturbance.  Respiratory: Negative for cough, shortness  of breath and wheezing.   Cardiovascular: Negative for chest pain, palpitations and leg swelling.  Gastrointestinal: Negative for abdominal pain, blood in stool, constipation, diarrhea and nausea.       No gerd  Genitourinary: Negative for dysuria and hematuria.  Musculoskeletal: Negative for arthralgias.  Skin: Negative for color change and rash.  Neurological: Negative for light-headedness and headaches.  Psychiatric/Behavioral: Negative for dysphoric mood. The patient is not nervous/anxious.        Objective:   Vitals:   05/02/16 0920  BP: 134/88  Pulse: 64  Resp: 16  Temp: 98.1 F (36.7 C)   Filed Weights   05/02/16 0920  Weight: 87 lb (39.5 kg)   Body mass index is 17.57 kg/m.   Physical Exam Constitutional: She appears well-developed and well-nourished. No distress.  HENT:  Head: Normocephalic  and atraumatic.  Right Ear: External ear normal. Normal ear canal and TM Left Ear: External ear normal.  Normal ear canal and TM Mouth/Throat: Oropharynx is clear and moist.  Eyes: Conjunctivae and EOM are normal.  Neck: Neck supple. No tracheal deviation present. No thyromegaly present.  No carotid bruit  Cardiovascular: Normal rate, regular rhythm and normal heart sounds.   No murmur heard.  No edema. Pulmonary/Chest: Effort normal and breath sounds normal. No respiratory distress. She has no wheezes. She has no rales.  Breast: deferred to Gyn Abdominal: Soft. She exhibits no distension. There is no tenderness.  Lymphadenopathy: She has no cervical adenopathy.  Skin: Skin is warm and dry. She is not diaphoretic.  Psychiatric: She has a normal mood and affect. Her behavior is normal.         Assessment & Plan:   Physical exam: Screening blood work ordered Immunizations  Flu up to date, will test for immunity for hepatitis A and vaccinate if neg Colonoscopy  Done in 2010 Mammogram  Up to date  Gyn      Up to date  Dexa   Up to date  Eye exams   Not up to date - schedule Exercise  - regular 7 days / week Weight  BMI low - appropriate for height Skin   No concerns Substance abuse  none  See Problem List for Assessment and Plan of chronic medical problems.  F/u annually

## 2016-05-03 LAB — HEPATITIS C ANTIBODY: HCV AB: NEGATIVE

## 2016-05-03 LAB — HEPATITIS A ANTIBODY, TOTAL: HEP A TOTAL AB: REACTIVE — AB

## 2016-05-09 ENCOUNTER — Other Ambulatory Visit: Payer: 59

## 2016-05-09 ENCOUNTER — Ambulatory Visit
Admission: RE | Admit: 2016-05-09 | Discharge: 2016-05-09 | Disposition: A | Discharge status: Home / Self Care | Payer: 59 | Source: Ambulatory Visit | Attending: Internal Medicine | Admitting: Internal Medicine

## 2016-05-09 DIAGNOSIS — B1910 Unspecified viral hepatitis B without hepatic coma: Secondary | ICD-10-CM

## 2016-05-09 LAB — HEPATITIS B DNA, ULTRAQUANTITATIVE, PCR
HEPATITIS B DNA (CALC): 3.22 {Log_IU}/mL — AB (ref <1.30)
Hepatitis B DNA: 1675 IU/mL — ABNORMAL HIGH (ref <20)

## 2016-05-12 ENCOUNTER — Encounter: Payer: Self-pay | Admitting: Internal Medicine

## 2016-05-14 ENCOUNTER — Encounter: Payer: Self-pay | Admitting: Internal Medicine

## 2016-05-16 ENCOUNTER — Ambulatory Visit: Payer: 59 | Admitting: Family Medicine

## 2016-05-20 DIAGNOSIS — G4710 Hypersomnia, unspecified: Secondary | ICD-10-CM | POA: Diagnosis not present

## 2016-05-27 ENCOUNTER — Ambulatory Visit: Payer: 59

## 2016-10-17 ENCOUNTER — Encounter: Payer: Self-pay | Admitting: Internal Medicine

## 2016-10-21 ENCOUNTER — Ambulatory Visit: Payer: 59 | Admitting: Internal Medicine

## 2016-11-04 ENCOUNTER — Encounter: Payer: Self-pay | Admitting: Internal Medicine

## 2016-11-05 DIAGNOSIS — M818 Other osteoporosis without current pathological fracture: Secondary | ICD-10-CM | POA: Diagnosis not present

## 2016-11-05 DIAGNOSIS — G4710 Hypersomnia, unspecified: Secondary | ICD-10-CM | POA: Diagnosis not present

## 2016-11-05 DIAGNOSIS — K589 Irritable bowel syndrome without diarrhea: Secondary | ICD-10-CM | POA: Diagnosis not present

## 2016-11-05 DIAGNOSIS — E785 Hyperlipidemia, unspecified: Secondary | ICD-10-CM | POA: Diagnosis not present

## 2016-11-06 ENCOUNTER — Other Ambulatory Visit: Payer: Self-pay | Admitting: Emergency Medicine

## 2016-11-06 MED ORDER — DICYCLOMINE HCL 20 MG PO TABS: 20.0000 mg | ORAL_TABLET | Freq: Three times a day (TID) | ORAL | 1 refills | Status: AC | PRN

## 2018-11-08 IMAGING — US US ABDOMEN LIMITED
1 series · 14 of 25 positions shown · non-contrast
Comparison: None.

CLINICAL DATA: Hepatitis-B

EXAM:
US ABDOMEN LIMITED - RIGHT UPPER QUADRANT

[Series 1: us abdomen limited · 0.26mm/px · 14 of 40 slices shown]
[im 1/40]
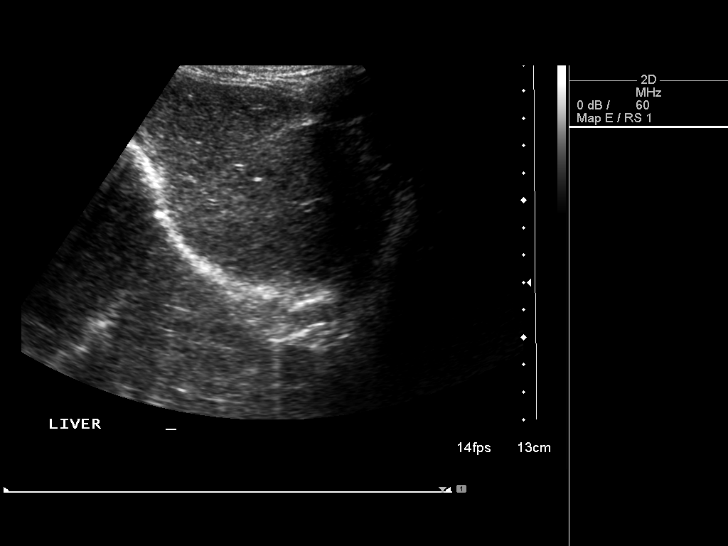
[im 4/40]
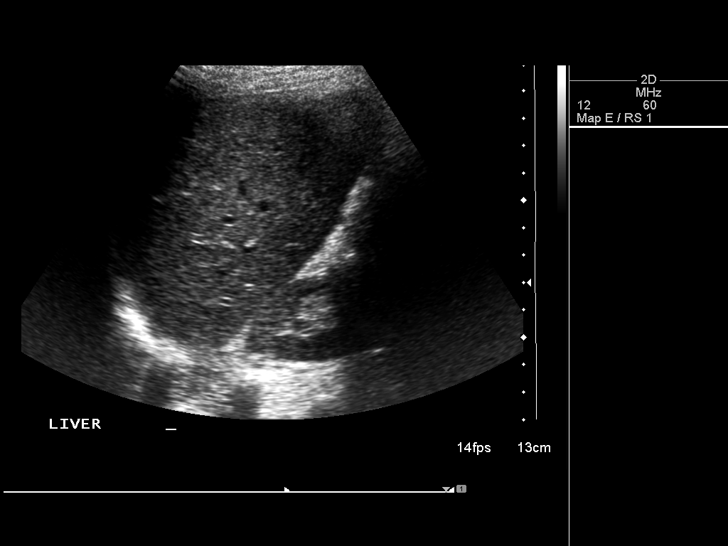
[im 7/40]
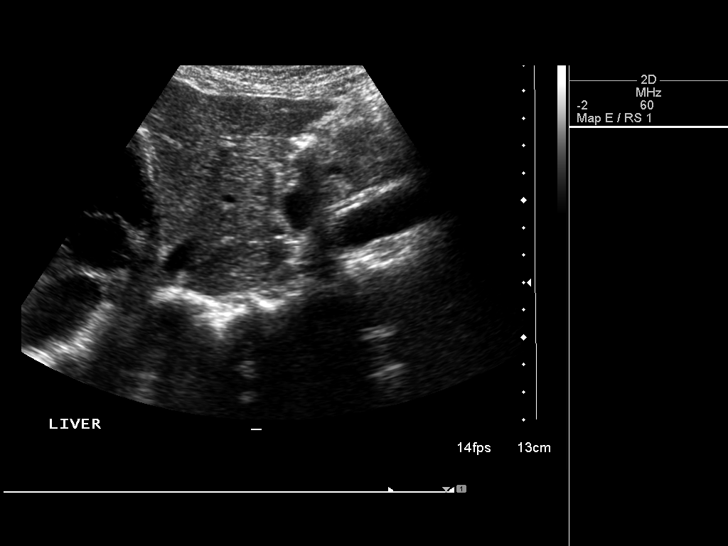
[im 10/40]
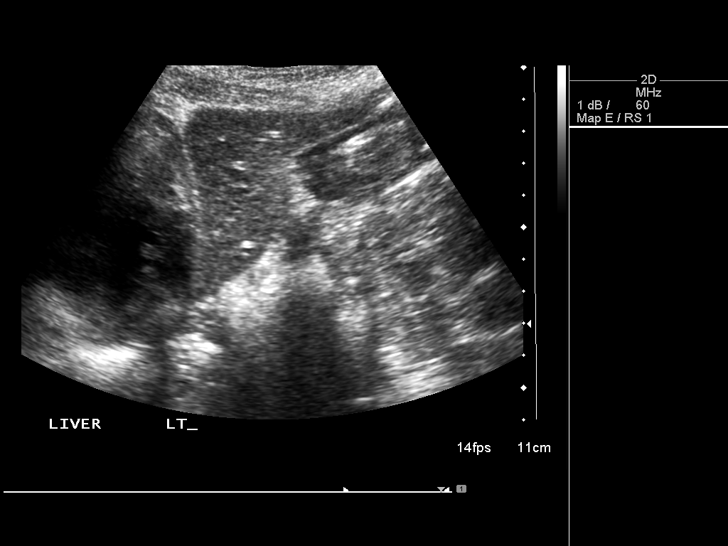
[im 14/40]
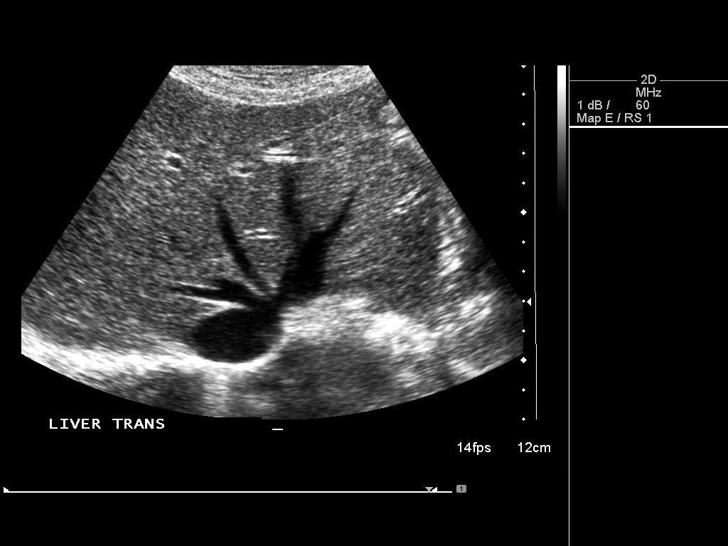
[im 15/40]
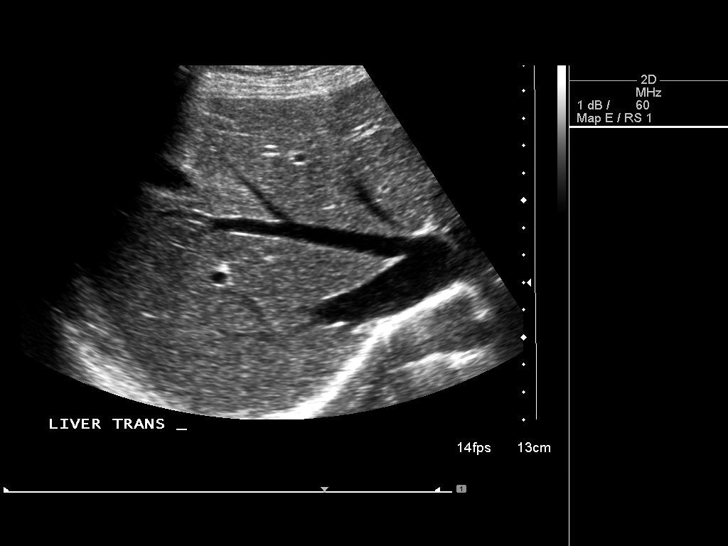
[im 18/40]
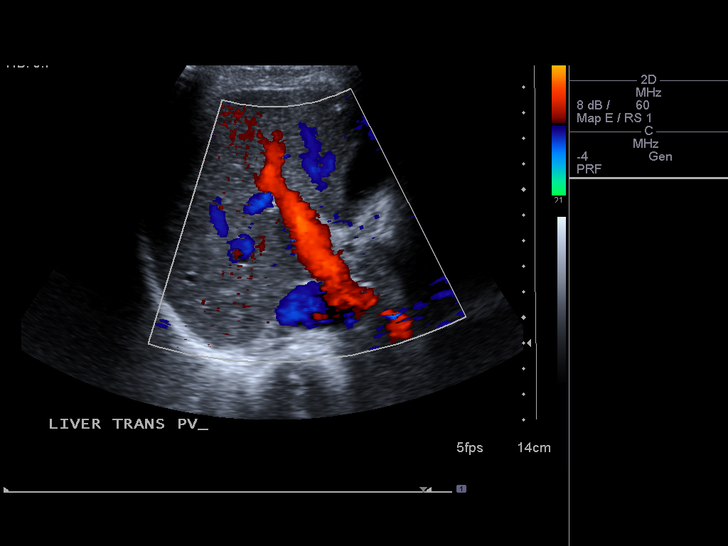
[im 22/40]
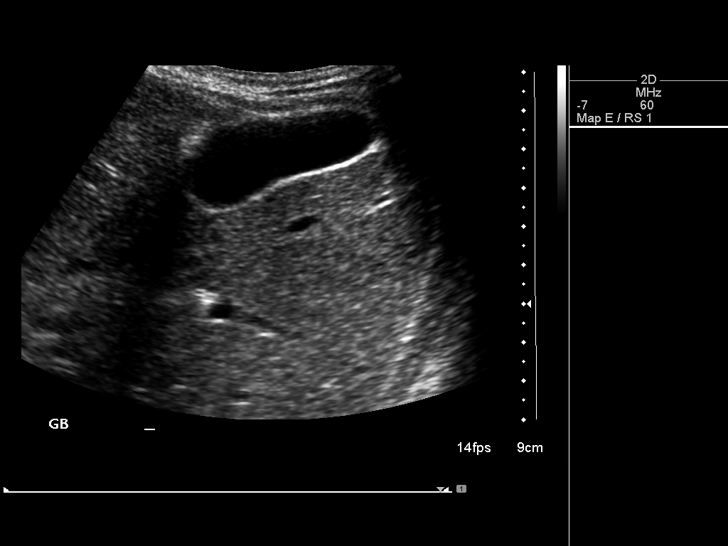
[im 25/40]
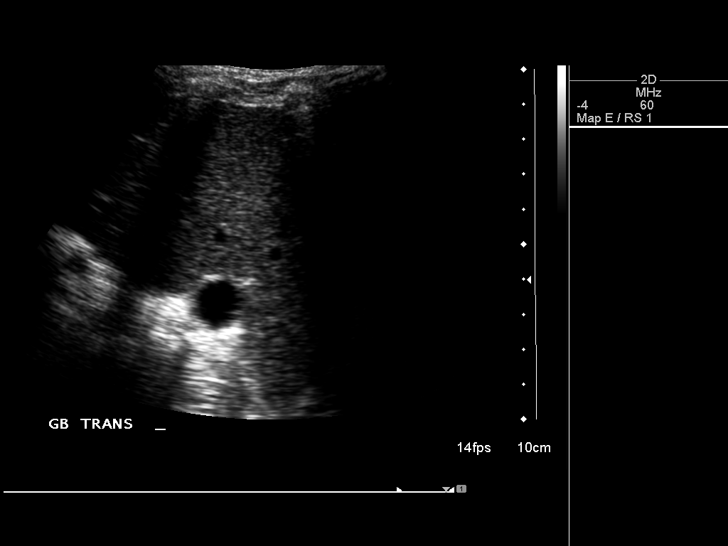
[im 27/40]
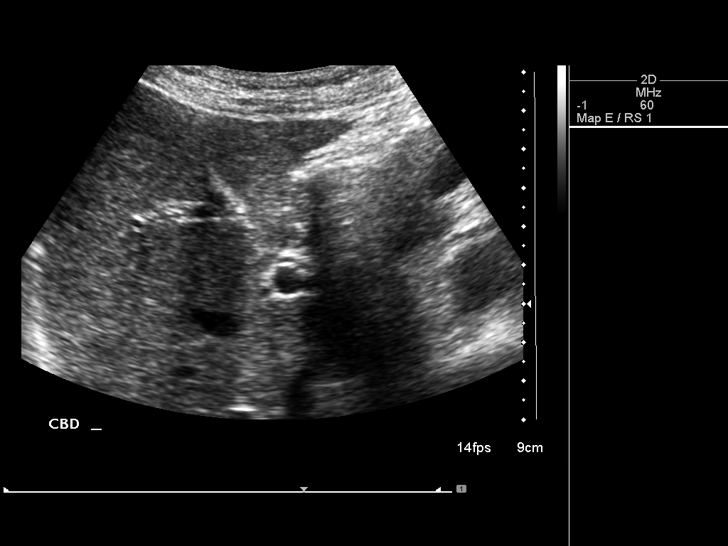
[im 30/40]
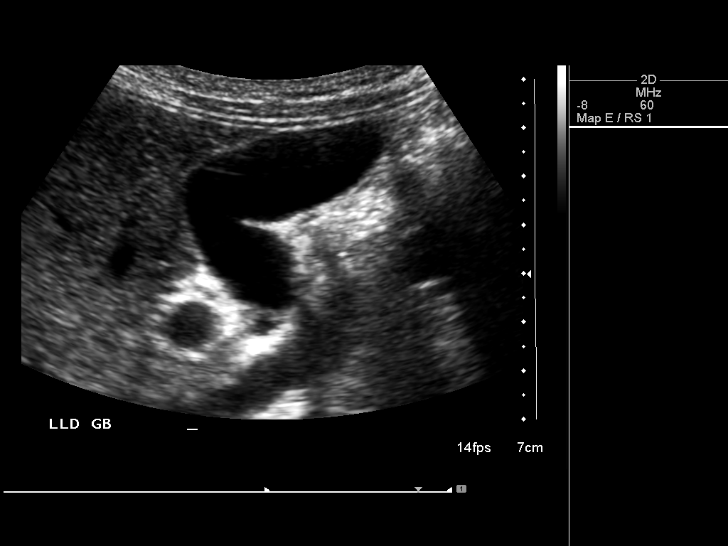
[im 33/40]
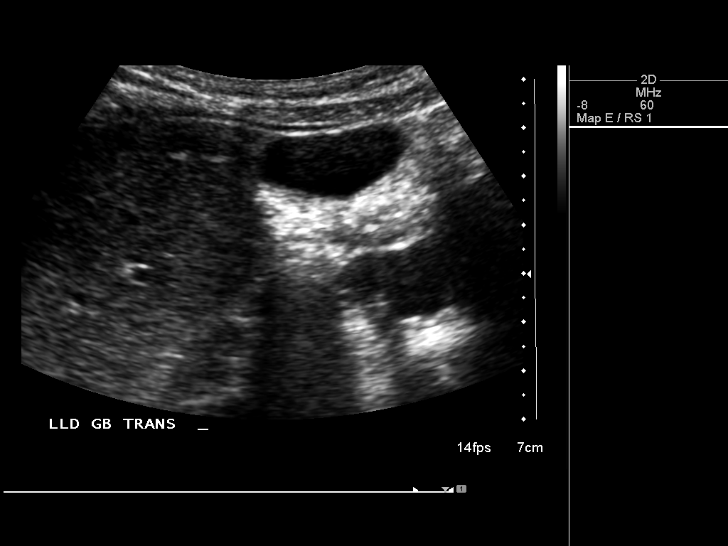
[im 36/40]
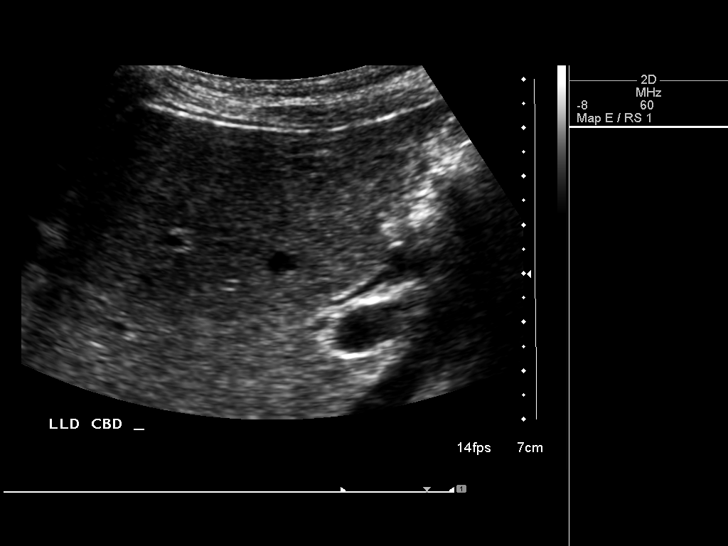
[im 40/40]
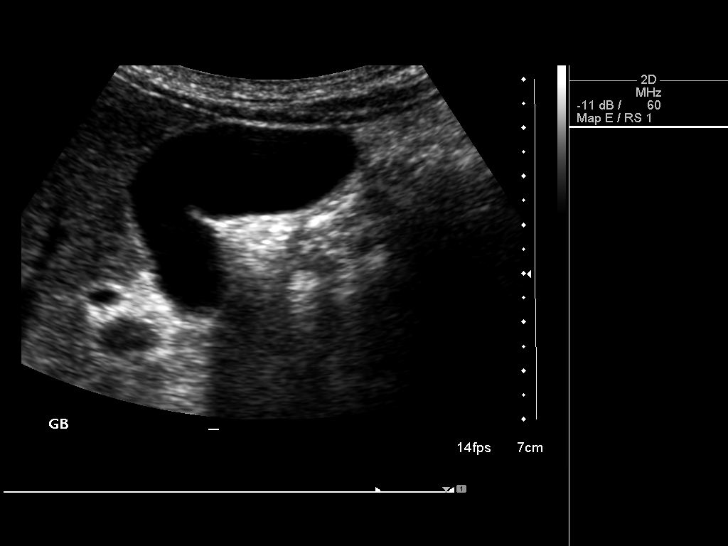

[14 of 25 positions shown; findings below may reference images not displayed]

FINDINGS: Gallbladder:

No gallstones or wall thickening visualized. No sonographic Murphy
sign noted by sonographer.

Common bile duct:

Diameter: Normal caliber, 2 mm

Liver:

No focal lesion identified. Within normal limits in parenchymal
echogenicity.
IMPRESSION: Unremarkable right upper quadrant ultrasound.
